# Patient Record
Sex: Female | Born: 1990 | Race: Black or African American | Hispanic: No | Marital: Single | Smoking: Current every day smoker
Health system: Southern US, Community
[De-identification: ages and names within clinical notes are randomized; demographics above are authoritative.]

## PROBLEM LIST (undated history)

## (undated) DIAGNOSIS — J302 Other seasonal allergic rhinitis: Secondary | ICD-10-CM

## (undated) HISTORY — DX: Other seasonal allergic rhinitis: J30.2

---

## 2010-01-18 ENCOUNTER — Other Ambulatory Visit: Admission: RE | Admit: 2010-01-18 | Discharge: 2010-01-18 | Payer: Self-pay | Admitting: *Deleted

## 2011-10-12 ENCOUNTER — Encounter: Payer: Self-pay | Admitting: Emergency Medicine

## 2011-10-12 ENCOUNTER — Emergency Department (INDEPENDENT_AMBULATORY_CARE_PROVIDER_SITE_OTHER)
Admission: EM | Admit: 2011-10-12 | Discharge: 2011-10-12 | Disposition: A | Discharge status: Home / Self Care | Payer: 59 | Source: Home / Self Care | Attending: Family Medicine | Admitting: Family Medicine

## 2011-10-12 DIAGNOSIS — J310 Chronic rhinitis: Secondary | ICD-10-CM

## 2011-10-12 MED ORDER — GUAIFENESIN-CODEINE 100-10 MG/5ML PO SYRP: 5.0000 mL | ORAL_SOLUTION | Freq: Four times a day (QID) | ORAL | Status: AC | PRN

## 2011-10-12 MED ORDER — FLUTICASONE PROPIONATE 50 MCG/ACT NA SUSP: 2.0000 | Freq: Every day | NASAL | Status: DC

## 2011-10-12 NOTE — ED Notes (Signed)
Pt here with sinusitis/uri that has been ongoing x 1wk.sx sore throat,post nasal drainage and yellow mocous.pt tried otc cold/cough meds but no relief.

## 2011-10-12 NOTE — ED Provider Notes (Signed)
History     CSN: [id number] Arrival date & time: 10/12/2011 12:15 PM   First MD Initiated Contact with Patient 10/12/11 1206      Chief Complaint  Patient presents with  . Sore Throat  . Sinusitis    (Consider location/radiation/quality/duration/timing/severity/associated sxs/prior treatment) HPI Comments: Crystal Anderson presents for evaluation of nasal congestion, sore throat, and cough over the last week. She reports a hx of allergies. She denies any fever or sick contacts. She has tried OTC preparations without relief.   Patient is a 20 y.o. female presenting with pharyngitis and sinusitis. The history is provided by the patient.  Sore Throat This is a new problem. The current episode started more than 1 week ago. The problem occurs constantly. The problem has not changed since onset.The symptoms are aggravated by nothing. The symptoms are relieved by nothing. She has tried acetaminophen (OTC cough and cold medicine) for the symptoms.  Sinusitis  Associated symptoms include congestion, sore throat and cough. Pertinent negatives include no sinus pressure.    History reviewed. No pertinent past medical history.  History reviewed. No pertinent past surgical history.  No family history on file.  History  Substance Use Topics  . Smoking status: Never Smoker   . Smokeless tobacco: Not on file  . Alcohol Use: Yes    OB History    Grav Para Term Preterm Abortions TAB SAB Ect Mult Living                  Review of Systems  Constitutional: Negative.   HENT: Positive for congestion, sore throat, rhinorrhea and tinnitus. Negative for trouble swallowing and sinus pressure.   Eyes: Negative.   Respiratory: Positive for cough.   Cardiovascular: Negative.   Gastrointestinal: Negative.   Genitourinary: Negative.   Musculoskeletal: Negative.   Skin: Negative.   Neurological: Negative.     Allergies  Review of patient's allergies indicates no known allergies.  Home Medications    No current outpatient prescriptions on file.  BP 121/63  Pulse 71  Temp(Src) 98.9 F (37.2 C) (Oral)  Resp 18  SpO2 99%  LMP 09/13/2011  Physical Exam  Nursing note and vitals reviewed. Constitutional: She is oriented to person, place, and time. She appears well-developed and well-nourished.  HENT:  Head: Normocephalic and atraumatic.  Right Ear: Tympanic membrane is retracted. Tympanic membrane is not erythematous and not bulging.  Left Ear: Tympanic membrane is retracted. Tympanic membrane is not erythematous and not bulging.  Mouth/Throat: Uvula is midline, oropharynx is clear and moist and mucous membranes are normal.  Eyes: EOM are normal.  Neck: Normal range of motion.  Pulmonary/Chest: Effort normal and breath sounds normal.  Neurological: She is alert and oriented to person, place, and time.  Skin: Skin is warm and dry.    ED Course  Procedures (including critical care time)  Labs Reviewed - No data to display No results found.   No diagnosis found.    MDM          Richardo Priest, MD 10/12/11 1336

## 2011-10-19 ENCOUNTER — Emergency Department (HOSPITAL_COMMUNITY)
Admission: EM | Admit: 2011-10-19 | Discharge: 2011-10-19 | Disposition: A | Discharge status: Home / Self Care | Payer: 59 | Source: Home / Self Care | Attending: Family Medicine | Admitting: Family Medicine

## 2011-10-19 ENCOUNTER — Emergency Department (INDEPENDENT_AMBULATORY_CARE_PROVIDER_SITE_OTHER): Payer: 59

## 2011-10-19 ENCOUNTER — Encounter (HOSPITAL_COMMUNITY): Payer: Self-pay | Admitting: Emergency Medicine

## 2011-10-19 DIAGNOSIS — J069 Acute upper respiratory infection, unspecified: Secondary | ICD-10-CM

## 2011-10-19 MED ORDER — AZITHROMYCIN 250 MG PO TABS: ORAL_TABLET | ORAL | Status: AC

## 2011-10-19 MED ORDER — IPRATROPIUM BROMIDE 0.06 % NA SOLN: 2.0000 | Freq: Four times a day (QID) | NASAL | Status: AC

## 2011-10-19 NOTE — ED Notes (Signed)
PT RETURNS TODAY WITH CONTINOUS COUGH WITH THICK YELLOW MUCOUS,CHILLS,AND SORE THROAT THAT'S ONGOING X 2WKS.PT WAS SEEN HERE LAST WEEK FOR SAME SX AND DIAG WITH SINUSITIS.RX COUGH SYRUP,FLONASE NASAL SPRAY NOT WORKING.NO FEVERS REPORTED OR SOB

## 2011-10-19 NOTE — ED Provider Notes (Signed)
History     CSN: [id number] Arrival date & time: 10/19/2011  4:05 PM   First MD Initiated Contact with Patient 10/19/11 1608      Chief Complaint  Patient presents with  . Influenza    (Consider location/radiation/quality/duration/timing/severity/associated sxs/prior treatment) Patient is a 20 y.o. female presenting with URI. The history is provided by the patient.  URI The primary symptoms include headaches and cough. Primary symptoms do not include fever, sore throat, wheezing, abdominal pain, nausea, vomiting or rash. The current episode started more than 1 week ago (seen 11/29 but sx worse.). This is a new problem. The problem has been gradually worsening.  Symptoms associated with the illness include facial pain, sinus pressure, congestion and rhinorrhea.    History reviewed. No pertinent past medical history.  History reviewed. No pertinent past surgical history.  No family history on file.  History  Substance Use Topics  . Smoking status: Current Everyday Smoker  . Smokeless tobacco: Not on file  . Alcohol Use: Yes    OB History    Grav Para Term Preterm Abortions TAB SAB Ect Mult Living                  Review of Systems  Constitutional: Negative for fever.  HENT: Positive for congestion, rhinorrhea and sinus pressure. Negative for sore throat.   Respiratory: Positive for cough. Negative for wheezing.   Gastrointestinal: Negative for nausea, vomiting and abdominal pain.  Skin: Negative for rash.  Neurological: Positive for headaches.    Allergies  Review of patient's allergies indicates no known allergies.  Home Medications   Current Outpatient Rx  Name Route Sig Dispense Refill  . GUAIFENESIN-CODEINE 100-10 MG/5ML PO SYRP Oral Take 5 mLs by mouth every 6 (six) hours as needed for cough or congestion. 120 mL 0  . AZITHROMYCIN 250 MG PO TABS  Take as directed on pack 6 each 0  . IPRATROPIUM BROMIDE 0.06 % NA SOLN Nasal Place 2 sprays into the nose 4  (four) times daily. 15 mL 12    BP 104/69  Pulse 91  Temp(Src) 99.8 F (37.7 C) (Oral)  Resp 18  SpO2 98%  LMP 09/13/2011  Physical Exam  Nursing note and vitals reviewed. Constitutional: She appears well-developed and well-nourished.  HENT:  Head: Normocephalic.  Right Ear: External ear normal.  Left Ear: External ear normal.  Nose: Mucosal edema and rhinorrhea present. Right sinus exhibits maxillary sinus tenderness and frontal sinus tenderness. Left sinus exhibits maxillary sinus tenderness and frontal sinus tenderness.  Mouth/Throat: Oropharynx is clear and moist.  Eyes: Conjunctivae and EOM are normal. Pupils are equal, round, and reactive to light.  Neck: Normal range of motion. Neck supple.  Cardiovascular: Normal rate, normal heart sounds and intact distal pulses.   Pulmonary/Chest: Effort normal and breath sounds normal.  Lymphadenopathy:    She has no cervical adenopathy.    ED Course  Procedures (including critical care time)  Labs Reviewed - No data to display Dg Sinuses Complete  10/19/2011  *RADIOLOGY REPORT*  Clinical Data: Headaches.  History of sinus infections.  PARANASAL SINUSES - COMPLETE 3 + VIEW  Comparison: None.  Findings: Paranasal sinuses appear clear.  No air fluid levels. Visualized bony structures unremarkable.  IMPRESSION: No evidence for sinusitis.  Original Report Authenticated By: Cyndie Chime, M.D.     1. URI (upper respiratory infection)       MDM  X-rays reviewed and report per radiologist.  Barkley Bruns, MD 10/19/11 [phone]

## 2014-05-18 ENCOUNTER — Ambulatory Visit (INDEPENDENT_AMBULATORY_CARE_PROVIDER_SITE_OTHER): Payer: BC Managed Care – PPO | Admitting: Family Medicine

## 2014-05-18 ENCOUNTER — Encounter (INDEPENDENT_AMBULATORY_CARE_PROVIDER_SITE_OTHER): Payer: Self-pay

## 2014-05-18 ENCOUNTER — Ambulatory Visit (INDEPENDENT_AMBULATORY_CARE_PROVIDER_SITE_OTHER): Payer: BC Managed Care – PPO | Attending: Family Medicine

## 2014-05-18 VITALS — BP 120/73 | HR 90 | Temp 98.7°F | Resp 16 | Ht 66.0 in | Wt 273.9 lb

## 2014-05-18 DIAGNOSIS — W19XXXA Unspecified fall, initial encounter: Secondary | ICD-10-CM

## 2014-05-18 DIAGNOSIS — Y998 Other external cause status: Secondary | ICD-10-CM

## 2014-05-18 DIAGNOSIS — Y939 Activity, unspecified: Secondary | ICD-10-CM

## 2014-05-18 DIAGNOSIS — S92301A Fracture of unspecified metatarsal bone(s), right foot, initial encounter for closed fracture: Secondary | ICD-10-CM

## 2014-05-18 DIAGNOSIS — S92309A Fracture of unspecified metatarsal bone(s), unspecified foot, initial encounter for closed fracture: Secondary | ICD-10-CM

## 2014-05-18 DIAGNOSIS — S99921A Unspecified injury of right foot, initial encounter: Secondary | ICD-10-CM

## 2014-05-18 DIAGNOSIS — Y929 Unspecified place or not applicable: Secondary | ICD-10-CM

## 2014-05-18 MED ORDER — HYDROCODONE-ACETAMINOPHEN 5-325 MG PO TABS
1.0000 | ORAL_TABLET | Freq: Four times a day (QID) | ORAL | Status: AC | PRN
Start: 2014-05-18 — End: 2015-05-18

## 2014-05-18 NOTE — Patient Instructions (Signed)
Fracture:Foot    You have a fracture (break) of one of the bones in your foot. This will cause pain, swelling and sometimes bruising. It will take about 4-6 weeks to heal. A foot fracture may be treated with a special shoe, splint, cast or boot.  Home Care:   You may be given a splint, cast, shoe or boot to prevent movement at the injury. Unless you were told otherwise, use crutches or a walker and do not bear weight on the injured foot until cleared by your doctor to do so. (Crutches and walkers can be rented at many pharmacies and surgical/orthopedic supply stores). Do not put weight on a splint; it will break.   Keep your leg elevated to reduce pain and swelling. When sleeping, place a pillow under the injured leg. When sitting, support the injured leg so it is level with your waist. This is very important during the first 48 hours.   Apply an ice pack (ice cubes in a plastic bag, wrapped in a towel) over the injured area for 20 minutes every 1-2 hours the first day. You can place the ice pack directly over the splint/cast. Unless told otherwise, you can open the boot or shoe to apply ice. Continue with ice packs 3-4 times a day for the next two days, then as needed for the relief of pain and swelling.   Keep the splint/cast/boot/shoe dry. When bathing, protect it with a large plastic bag, rubber-banded at the top end. If a fiberglass splint/cast or boot gets wet, you can dry it with a hair-dryer. Unless told otherwise, you can remove a boot or shoe to bathe.   You may use acetaminophen (Tylenol) or ibuprofen (Motrin, Advil) to control pain, unless another pain medicine was prescribed. [NOTE: If you have chronic liver or kidney disease or ever had a stomach ulcer or GI bleeding, talk with your doctor before using these medicines.]  Follow Up  with your doctor within one week, or as advised by our staff, to be sure the bone is healing properly. If you were given a splint, it may be changed to a cast or boot  at your follow-up visit.[NOTE: A radiologist will review any X-rays that were taken. We will notify you of any new findings that may affect your care.]  Get Prompt Medical Attention  if any of the following occur:   The plaster cast or splint becomes wet or soft   The fiberglass cast or splint remains wet for more than 24 hours   Increased tightness or pain under the cast or splint   Toes become swollen, cold, blue, numb or tingly   2000-2014 [employer] [address], [place] [address]. All rights reserved. This information is not intended as a substitute for professional medical care. Always follow your healthcare professional's instructions.

## 2014-05-18 NOTE — Progress Notes (Signed)
Applied an ace wrap to the right foot and also gave the patient crutches with crutch training. Patient felt comfortable with crutches.  No wounds noted underneath splint. Assessed for pain, burning, tingling, and numbness. Patient denies all of the above symptoms after splint applied. Pulses are present, skin is pink and capillary refill is normal.

## 2014-05-18 NOTE — Progress Notes (Signed)
Subjective:    Patient ID: Sherry Goodman is a 23 y.o. female.    Foot Injury   The incident occurred less than 1 hour ago. The injury mechanism was a fall. The pain is present in the right foot. The quality of the pain is described as aching. The pain is moderate. Associated symptoms include an inability to bear weight. Pertinent negatives include no loss of motion, loss of sensation, muscle weakness, numbness or tingling. The symptoms are aggravated by movement, palpation and weight bearing. She has tried nothing for the symptoms.       The following portions of the patient's history were reviewed and updated as appropriate: past surgical history and problem list.    Review of Systems   Constitutional: Negative.    Musculoskeletal: Positive for joint swelling (rt foot), arthralgias and gait problem.   Skin: Negative for rash.   Neurological: Negative for tingling and numbness.         Objective:    BP 120/73 mmHg  Pulse 90  Temp(Src) 98.7 F (37.1 C) (Oral)  Resp 16  Ht 1.676 m (5\' 6" )  Wt 124.24 kg (273 lb 14.4 oz)  BMI 44.23 kg/m2  LMP     Physical Exam   Constitutional: She is oriented to person, place, and time. She appears well-developed and well-nourished. No distress.   Musculoskeletal:        Right ankle: Normal.        Right foot: There is decreased range of motion, tenderness, bony tenderness and swelling. There is no deformity and no laceration.        Feet:    Neurological: She is alert and oriented to person, place, and time.   Skin: She is not diaphoretic.   Psychiatric: She has a normal mood and affect.         Assessment and Plan:       Sherry Goodman was seen today for foot injury.    Diagnoses and associated orders for this visit:    Right foot injury, initial encounter  - XR Foot Right  3 views    Metatarsal fracture, right, closed, initial encounter  - HYDROcodone-acetaminophen (NORCO) 5-325 MG per tablet; Take 1 tablet by mouth every 6 (six) hours as needed for Pain.      Discussed side  effects of norco. Advised not to drive or use machinery. F/u with ortho soon.list provided.  TYLENOL/IBUPROFEN PRN FOR PAIN    Advise to watch for worsening symptoms,with those seek medical help immediately  F/U PRN            Etheleen Sia, MD  [facility]  05/18/2014  8:35 PM

## 2014-05-25 ENCOUNTER — Other Ambulatory Visit
Admission: RE | Admit: 2014-05-25 | Discharge: 2014-05-25 | Disposition: A | Discharge status: Home / Self Care | Payer: Self-pay | Source: Ambulatory Visit | Attending: Family Medicine | Admitting: Family Medicine

## 2014-05-25 ENCOUNTER — Ambulatory Visit (INDEPENDENT_AMBULATORY_CARE_PROVIDER_SITE_OTHER): Payer: BC Managed Care – PPO | Admitting: Family Medicine

## 2014-05-25 ENCOUNTER — Encounter (INDEPENDENT_AMBULATORY_CARE_PROVIDER_SITE_OTHER): Payer: Self-pay

## 2014-05-25 VITALS — BP 113/62 | HR 76 | Temp 98.1°F | Resp 18 | Ht 67.0 in | Wt 270.0 lb

## 2014-05-25 DIAGNOSIS — R300 Dysuria: Secondary | ICD-10-CM | POA: Insufficient documentation

## 2014-05-25 DIAGNOSIS — N390 Urinary tract infection, site not specified: Secondary | ICD-10-CM

## 2014-05-25 LAB — VH POCT UA-AUTOMATED(UCC)
Bilirubin, UA POCT: NEGATIVE
Glucose, UA POCT: NEGATIVE
Ketones, UA POCT: NEGATIVE mg/dL
Nitrite, UA POCT: POSITIVE — AB
PH, UA POCT: 5.5 (ref 4.6–8)
Protein, UA POCT: 30 mg/dL — AB
Specific Gravity, UA POCT: 1.02 mg/dL (ref 1.001–1.035)
Urobilinogen, UA POCT: 1 mg/dL

## 2014-05-25 MED ORDER — SULFAMETHOXAZOLE-TRIMETHOPRIM 800-160 MG PO TABS
1.0000 | ORAL_TABLET | Freq: Two times a day (BID) | ORAL | Status: AC
Start: 2014-05-25 — End: 2014-05-30

## 2014-05-25 MED ORDER — PHENAZOPYRIDINE HCL 200 MG PO TABS
200.0000 mg | ORAL_TABLET | Freq: Three times a day (TID) | ORAL | Status: AC | PRN
Start: 2014-05-25 — End: 2015-05-25

## 2014-05-25 NOTE — Patient Instructions (Signed)
Bladder Infection,Female (Adult)    A bladder infection ("cystitis" or "UTI") usually causes a constant urge to urinate and a burning when passing urine. Urine may be cloudy, smelly or dark. There may be pain in the lower abdomen. A bladder infection occurs when bacteria from the vaginal area enter the bladder opening (urethra). This can occur from sexual intercourse, wearing tight clothing, dehydration and other factors.  Home Care:   Drink lots of fluids (at least 6-8 glasses a day, unless you must restrict fluids for other medical reasons). This will force the medicine into your urinary system and flush the bacteria out of your body.   Avoid sexual intercourse until your symptoms are gone.   Avoid caffeine, alcohol and spicy foods. These can irritate the bladder.   A bladder infection is treated with antibiotics. You may also be given Pyridium (generic = phenazopyridine) to reduce the burning sensation. This medicine will cause your urine to become a bright orange color. The orange urine may stain clothing. You may wear a pad or panty-liner to protect clothing.  Preventing Future Infections:   Always wipe from front to back after a bowel movement.   Keep the genital area clean and dry.   Drink plenty of fluids each day to avoid dehydration.   Both sexual partners should wash before intercourse.   Urinate right after intercourse to flush out the bladder.   Wear cotton underwear and cotton-lined panty hose; avoid tight-fitting pants.   If you are on birth control pills and are having frequent bladder infections, discuss with your doctor.  Follow Up:  Return to this facility or see your doctor if ALL symptoms are not gone after three days of treatment.  Get Prompt Medical Attention  if any of the following occur:   Fever of 100.4F (38C) or higher, or as directed by your healthcare provider   No improvement by the third day of treatment   Increasing back or abdominal pain   Repeated vomiting;  unable to keep medicine down   Weakness, dizziness or fainting   Vaginal discharge   Pain, redness or swelling in the labia (outer vaginal area)   2000-2014 [employer]. [address], [place] [address]. All rights reserved. This information is not intended as a substitute for professional medical care. Always follow your healthcare professional's instructions.

## 2014-05-25 NOTE — Progress Notes (Signed)
Subjective:    Patient ID: Sherry Goodman 23 y.o.    HPI  Patient complains of burning with urination, dysuria, frequency, hesitancy and urgency She has had symptoms for a few days.  Patient denies abnormal smelling urine, hematuria, hesitancy, nausea and vomiting back pain and fever. Patient does not have a history of recurrent UTI.  Patient does not have a history of pyelonephritis.      The following portions of the patient's history were reviewed and updated as appropriate: allergies, current medications, past medical history, past surgical history and problem list.    Review of Systems   Constitutional: Negative for fever and chills.   HENT: Negative for congestion.    Respiratory: Negative for cough.    Cardiovascular: Negative for chest pain.   Gastrointestinal: Negative for nausea and vomiting.   Genitourinary: Positive for dysuria, urgency and frequency. Negative for hematuria and flank pain.   Musculoskeletal: Negative for joint pain.   Skin: Negative for rash.   Neurological: Negative for headaches.         Objective:     Filed Vitals:    05/25/14 1649   BP: 113/62   Pulse: 76   Temp: 98.1 F (36.7 C)   Resp: 18       Physical Exam  Constitutional: oriented to person, place, and time. Appears well-developed.   Pulmonary/Chest: Effort normal and breath sounds normal.  Cardiovascular: regular rate and rhythm  Abdomen: soft, non tender, no organomegaly, no CVA tenderness  Skin: Skin is warm.            Assessment and Plan:       The primary encounter diagnosis was Dysuria. A diagnosis of UTI (lower urinary tract infection) was also pertinent to this visit.    UA ordered and reviewed. Likely UTI. Start on Bactrim. Recommend to drink plenty of fluid, tylenol/NSAIDS, urine culture will be sent. If no better or gets worst, need to call. The patient understands and agree with the plan.       Jodelle Red, MD  [facility]  05/25/2014 5:38 PM

## 2014-05-25 NOTE — Progress Notes (Signed)
Urine Culture Sent.    [facility] No        Sherry Goodman Sherry Goodman  05/25/2014  5:05 PM

## 2014-05-29 ENCOUNTER — Telehealth (INDEPENDENT_AMBULATORY_CARE_PROVIDER_SITE_OTHER): Payer: Self-pay

## 2014-05-29 NOTE — Telephone Encounter (Signed)
Called to follow up with patient. Left message and told to call back if needed and provided our number.

## 2017-05-10 ENCOUNTER — Emergency Department (HOSPITAL_COMMUNITY): Payer: Medicaid Other

## 2017-05-10 ENCOUNTER — Encounter (HOSPITAL_COMMUNITY): Payer: Self-pay

## 2017-05-10 ENCOUNTER — Emergency Department (HOSPITAL_COMMUNITY)
Admission: EM | Admit: 2017-05-10 | Discharge: 2017-05-10 | Disposition: A | Discharge status: Home / Self Care | Payer: Medicaid Other | Attending: Emergency Medicine | Admitting: Emergency Medicine

## 2017-05-10 DIAGNOSIS — Z3201 Encounter for pregnancy test, result positive: Secondary | ICD-10-CM | POA: Insufficient documentation

## 2017-05-10 DIAGNOSIS — F17200 Nicotine dependence, unspecified, uncomplicated: Secondary | ICD-10-CM | POA: Diagnosis not present

## 2017-05-10 DIAGNOSIS — Z79899 Other long term (current) drug therapy: Secondary | ICD-10-CM | POA: Diagnosis not present

## 2017-05-10 DIAGNOSIS — K8020 Calculus of gallbladder without cholecystitis without obstruction: Secondary | ICD-10-CM | POA: Insufficient documentation

## 2017-05-10 DIAGNOSIS — R1013 Epigastric pain: Secondary | ICD-10-CM | POA: Diagnosis present

## 2017-05-10 LAB — URINALYSIS, ROUTINE W REFLEX MICROSCOPIC
Bilirubin Urine: NEGATIVE
Glucose, UA: 50 mg/dL — AB
Hgb urine dipstick: NEGATIVE
Ketones, ur: 20 mg/dL — AB
Leukocytes, UA: NEGATIVE
NITRITE: NEGATIVE
Protein, ur: NEGATIVE mg/dL
SPECIFIC GRAVITY, URINE: 1.019 (ref 1.005–1.030)
pH: 6 (ref 5.0–8.0)

## 2017-05-10 LAB — CBC
HCT: 36 % (ref 36.0–46.0)
HEMOGLOBIN: 12.3 g/dL (ref 12.0–15.0)
MCH: 27.9 pg (ref 26.0–34.0)
MCHC: 34.2 g/dL (ref 30.0–36.0)
MCV: 81.6 fL (ref 78.0–100.0)
Platelets: 345 10*3/uL (ref 150–400)
RBC: 4.41 MIL/uL (ref 3.87–5.11)
RDW: 13.9 % (ref 11.5–15.5)
WBC: 8.3 10*3/uL (ref 4.0–10.5)

## 2017-05-10 LAB — COMPREHENSIVE METABOLIC PANEL
ALK PHOS: 72 U/L (ref 38–126)
ALT: 12 U/L — ABNORMAL LOW (ref 14–54)
ANION GAP: 8 (ref 5–15)
AST: 18 U/L (ref 15–41)
Albumin: 3.8 g/dL (ref 3.5–5.0)
BILIRUBIN TOTAL: 0.4 mg/dL (ref 0.3–1.2)
BUN: 9 mg/dL (ref 6–20)
CALCIUM: 9 mg/dL (ref 8.9–10.3)
CO2: 22 mmol/L (ref 22–32)
Chloride: 103 mmol/L (ref 101–111)
Creatinine, Ser: 0.89 mg/dL (ref 0.44–1.00)
GFR calc non Af Amer: >60 mL/min (ref >60)
Glucose, Bld: 167 mg/dL — ABNORMAL HIGH (ref 65–99)
Potassium: 3.3 mmol/L — ABNORMAL LOW (ref 3.5–5.1)
SODIUM: 133 mmol/L — AB (ref 135–145)
TOTAL PROTEIN: 7.6 g/dL (ref 6.5–8.1)

## 2017-05-10 LAB — LIPASE, BLOOD: Lipase: 21 U/L (ref 11–51)

## 2017-05-10 LAB — I-STAT BETA HCG BLOOD, ED (MC, WL, AP ONLY): HCG, QUANTITATIVE: 38.8 m[IU]/mL — AB (ref <5)

## 2017-05-10 MED ORDER — ONDANSETRON 4 MG PO TBDP
ORAL_TABLET | ORAL | Status: AC
  Filled 2017-05-10: qty 1

## 2017-05-10 MED ORDER — METOCLOPRAMIDE HCL 5 MG/ML IJ SOLN
10.0000 mg | Freq: Once | INTRAMUSCULAR | Status: AC
  Administered 2017-05-10: 10 mg via INTRAVENOUS
  Filled 2017-05-10: qty 2

## 2017-05-10 MED ORDER — VITAMIN B-6 100 MG PO TABS
100.0000 mg | ORAL_TABLET | Freq: Every day | ORAL | Status: DC
  Administered 2017-05-10: 100 mg via ORAL
  Filled 2017-05-10: qty 1

## 2017-05-10 MED ORDER — POTASSIUM CHLORIDE CRYS ER 20 MEQ PO TBCR
40.0000 meq | EXTENDED_RELEASE_TABLET | Freq: Once | ORAL | Status: AC
Start: 2017-05-10 — End: 2017-05-10
  Administered 2017-05-10: 40 meq via ORAL
  Filled 2017-05-10: qty 2

## 2017-05-10 MED ORDER — SODIUM CHLORIDE 0.9 % IV BOLUS (SEPSIS)
1000.0000 mL | Freq: Once | INTRAVENOUS | Status: AC
  Administered 2017-05-10: 1000 mL via INTRAVENOUS

## 2017-05-10 MED ORDER — ONDANSETRON 4 MG PO TBDP
4.0000 mg | ORAL_TABLET | Freq: Once | ORAL | Status: AC | PRN
  Administered 2017-05-10: 4 mg via ORAL

## 2017-05-10 MED ORDER — ALUM & MAG HYDROXIDE-SIMETH 200-200-20 MG/5ML PO SUSP
15.0000 mL | Freq: Once | ORAL | Status: AC
Start: 2017-05-10 — End: 2017-05-10
  Administered 2017-05-10: 15 mL via ORAL
  Filled 2017-05-10: qty 30

## 2017-05-10 MED ORDER — PRENATAL COMPLETE 14-0.4 MG PO TABS: 1.0000 | ORAL_TABLET | Freq: Every day | ORAL | 0 refills | Status: AC

## 2017-05-10 MED ORDER — ACETAMINOPHEN 325 MG PO TABS
650.0000 mg | ORAL_TABLET | Freq: Once | ORAL | Status: AC
  Administered 2017-05-10: 650 mg via ORAL
  Filled 2017-05-10: qty 2

## 2017-05-10 MED ORDER — METOCLOPRAMIDE HCL 10 MG PO TABS: 10.0000 mg | ORAL_TABLET | Freq: Three times a day (TID) | ORAL | 0 refills | Status: AC | PRN

## 2017-05-10 NOTE — ED Notes (Signed)
Pt stated that she could not use the bathroom right now.

## 2017-05-10 NOTE — ED Provider Notes (Signed)
[facility]Y DEPT Provider Note   CSN: [id number] Arrival date & time: 05/10/17  6295     History   Chief Complaint Chief Complaint  Patient presents with  . Abdominal Pain    HPI Crystal Anderson is a 26 y.o. female.  The history is provided by the patient and medical records. No language interpreter was used.  Abdominal Pain   Associated symptoms include nausea. Pertinent negatives include diarrhea, vomiting and constipation.   Crystal Anderson is an otherwise healthy 26 y.o. female who presents to the Emergency Department complaining of epigastric abdominal pain intermittently for several months, but acutely worsening over the last 2-3 days. Patient states she initially thought that spicy foods were triggering her symptoms, however as time progressed, every time she ate, her symptoms would return. Over the last couple of days, she has not wanted to eat and has felt nauseous. She tried some type of OTC gas medicine with little relief. She is about a week late for menses, typically having very regular cycles. Did have unprotected intercourse and concerned she may be pregnant. No lower abdominal pain, dysuria, vaginal discharge, urinary urgency/frequency, back pain, chest pain, trouble breathing, fevers/chills.   History reviewed. No pertinent past medical history.  There are no active problems to display for this patient.   History reviewed. No pertinent surgical history.  OB History    No data available       Home Medications    Prior to Admission medications   Medication Sig Start Date End Date Taking? Authorizing Provider  acetaminophen (TYLENOL) 325 MG tablet Take 650 mg by mouth every 6 (six) hours as needed for mild pain.   Yes [provider]  diphenhydrAMINE (BENADRYL) 25 MG tablet Take 25 mg by mouth every 6 (six) hours as needed for allergies.   Yes [provider]  ibuprofen (ADVIL,MOTRIN) 200 MG tablet Take 200 mg by mouth every 6 (six) hours  as needed for moderate pain.   Yes [provider]  ipratropium (ATROVENT) 0.06 % nasal spray Place 2 sprays into the nose 4 (four) times daily. 10/19/11 10/18/12  Linna Hoff, MD  metoCLOPramide (REGLAN) 10 MG tablet Take 1 tablet (10 mg total) by mouth every 8 (eight) hours as needed for refractory nausea / vomiting. 05/10/17   Ward, Chase Picket, PA-C  Prenatal Vit-Fe Fumarate-FA (PRENATAL COMPLETE) 14-0.4 MG TABS Take 1 tablet by mouth daily. 05/10/17   Ward, Chase Picket, PA-C    Family History No family history on file.  Social History Social History  Substance Use Topics  . Smoking status: Current Every Day Smoker  . Smokeless tobacco: Never Used  . Alcohol use Yes     Comment: Occasional      Allergies   Patient has no known allergies.   Review of Systems Review of Systems  Gastrointestinal: Positive for abdominal pain and nausea. Negative for blood in stool, constipation, diarrhea and vomiting.  All other systems reviewed and are negative.    Physical Exam Updated Vital Signs BP (!) 100/58 (BP Location: Left Arm)   Pulse 70   Temp 97.5 F (36.4 C) (Oral)   Resp 18   Ht 5\' 6"  (1.676 m)   Wt 104.3 kg (230 lb)   LMP 04/01/2017   SpO2 100%   BMI 37.12 kg/m   Physical Exam  Constitutional: She is oriented to person, place, and time. She appears well-developed and well-nourished. No distress.  HENT:  Head: Normocephalic and atraumatic.  Cardiovascular: Normal rate,  regular rhythm and normal heart sounds.   No murmur heard. Pulmonary/Chest: Effort normal and breath sounds normal. No respiratory distress.  Abdominal: Soft. Bowel sounds are normal. She exhibits no distension.  Tenderness to palpation of epigastrium and RUQ. Negative Murphy's. No rebound or guarding.  Musculoskeletal: She exhibits no edema.  Neurological: She is alert and oriented to person, place, and time.  Skin: Skin is warm and dry.  Nursing note and vitals reviewed.    ED  Treatments / Results  Labs (all labs ordered are listed, but only abnormal results are displayed) Labs Reviewed  COMPREHENSIVE METABOLIC PANEL - Abnormal; Notable for the following:       Result Value   Sodium 133 (*)    Potassium 3.3 (*)    Glucose, Bld 167 (*)    ALT 12 (*)    All other components within normal limits  URINALYSIS, ROUTINE W REFLEX MICROSCOPIC - Abnormal; Notable for the following:    APPearance HAZY (*)    Glucose, UA 50 (*)    Ketones, ur 20 (*)    All other components within normal limits  I-STAT BETA HCG BLOOD, ED ([facility], [facility], [facility] ONLY) - Abnormal; Notable for the following:    I-stat hCG, quantitative 38.8 (*)    All other components within normal limits  LIPASE, BLOOD  CBC    EKG  EKG Interpretation None       Radiology US Abdomen Limited Ruq  Result Date: 05/10/2017 CLINICAL DATA:  Epigastric pain EXAM: ULTRASOUND ABDOMEN LIMITED RIGHT UPPER QUADRANT COMPARISON:  None in PACs FINDINGS: Gallbladder: The gallbladder is adequately distended and filled with sludge. One stone is visible measuring 1.8 cm in diameter. There is no gallbladder wall thickening, pericholecystic fluid, or positive sonographic Murphy's sign. Common bile duct: Diameter: 3.6 mm.  No intraluminal stones or sludge are observed. Liver: No focal lesion identified. Within normal limits in parenchymal echogenicity. IMPRESSION: Gallstones and sludge. No sonographic evidence of acute cholecystitis. Normal appearance of the liver and common bile Electronically Signed   By: David  [place] M.D.   On: 05/10/2017 11:36    Procedures Procedures (including critical care time)  Medications Ordered in ED Medications  ondansetron (ZOFRAN-ODT) 4 MG disintegrating tablet (not administered)  pyridOXINE (VITAMIN B-6) tablet 100 mg (100 mg Oral Given 05/10/17 1151)  ondansetron (ZOFRAN-ODT) disintegrating tablet 4 mg (4 mg Oral Given 05/10/17 0748)  sodium chloride 0.9 % bolus 1,000 mL (0 mLs Intravenous  Stopped 05/10/17 1204)  potassium chloride SA (K-DUR,KLOR-CON) CR tablet 40 mEq (40 mEq Oral Given 05/10/17 1151)  alum & mag hydroxide-simeth (MAALOX/MYLANTA) 200-200-20 MG/5ML suspension 15 mL (15 mLs Oral Given 05/10/17 1059)  acetaminophen (TYLENOL) tablet 650 mg (650 mg Oral Given 05/10/17 1236)  metoCLOPramide (REGLAN) injection 10 mg (10 mg Intravenous Given 05/10/17 1236)     Initial Impression / Assessment and Plan / ED Course  I have reviewed the triage vital signs and the nursing notes.  Pertinent labs & imaging results that were available during my care of the patient were reviewed by me and considered in my medical decision making (see chart for details).    Crystal Anderson is a 26 y.o. female who presents to ED for epigastric abdominal pain, worse with eating for several months. Symptoms acutely worsened over the last week and have now been associated with nausea. On exam, patient is afebrile, hemodynamically stable with tenderness to palpation of epigastrium and RUQ. Negative Murphy's. Hcg at 38.8. She is a week late for  menses. Informed her of + pregnancy test. She is having no urinary symptoms, lower abdominal complaints, vaginal discharge or spotting. No lower abdominal tenderness or CVA tenderness on exam. UA with no signs of infection. Normal white count. Mild hypokalemia which was replenished in ED. RUQ ultrasound was obtained showing gallstones and sludge. No evidence of acute cholecystitis. Clinically doubt acute cholecystitis as well. Symptoms controlled in ED. Tolerating PO.   A&P:  Epigastric pain 2/2 biliary colic: referral to general surgery. Tylenol PRN pain.   Positive pregnancy test, nausea in pregnancy: OBGYN follow up - referral info given. B6 TID and doxylamine at night for nausea. Small amount of reglan PRN refractory nausea/vomiting. Started on prenatal vitamins.   Spoke at length about reasons to return to ER and follow up care. She understands home care  instructions as plan as dictated above. All questions answered.   Final Clinical Impressions(s) / ED Diagnoses   Final diagnoses:  Epigastric pain  Gallstones  Positive pregnancy test    New Prescriptions Discharge Medication List as of 05/10/2017  1:21 PM    START taking these medications   Details  metoCLOPramide (REGLAN) 10 MG tablet Take 1 tablet (10 mg total) by mouth every 8 (eight) hours as needed for refractory nausea / vomiting., Starting Thu 05/10/2017, Print    Prenatal Vit-Fe Fumarate-FA (PRENATAL COMPLETE) 14-0.4 MG TABS Take 1 tablet by mouth daily., Starting Thu 05/10/2017, Print         Ward, Chase PicketJaime Pilcher, PA-C 05/10/17 1457    Doug SouJacubowitz, Sam, MD 05/10/17 1721

## 2017-05-10 NOTE — Discharge Instructions (Signed)
For nausea in pregnancy: Take 75-100mg  of B6 three times a day. Unisom (doxylamine) at night for nausea. These are sold over-the-counter, but if you need help finding these medications or have questions, please ask the pharmacist. Use reglan only as needed for nausea not relieved with the above medicines.   Take prenatal vitamin daily.   Tylenol as needed for abdominal pain.  You will need to follow up with an OBGYN. You can see an OBGYN of your choice. I have listed the women's clinic for your information if you would like to call them to schedule a follow up appointment.   For your gallstones, please call the surgery clinic to schedule a follow up appointment for further discussion of managing your symptoms.   Return to ER for fevers, acute worsening of abdominal pain, inability to keep fluids down due to vomiting, new or worsening symptoms, any additional concerns.

## 2017-05-10 NOTE — ED Notes (Signed)
Gave pt a ginger ale to drink 

## 2017-05-10 NOTE — ED Notes (Signed)
Patient transported to Ultrasound 

## 2017-05-10 NOTE — ED Triage Notes (Signed)
Per Pt, Pt Is coming from home with complaints of upper abdominal pain that started yesterday and has been recurrent for months. Pt reports some nausea and three episodes of vomiting. Denies any change in bowel, urinary symptoms, or vaginal discharge.

## 2017-07-10 ENCOUNTER — Ambulatory Visit (INDEPENDENT_AMBULATORY_CARE_PROVIDER_SITE_OTHER): Payer: Medicaid Other | Admitting: Certified Nurse Midwife

## 2017-07-10 ENCOUNTER — Other Ambulatory Visit (HOSPITAL_COMMUNITY)
Admission: RE | Admit: 2017-07-10 | Discharge: 2017-07-10 | Disposition: A | Discharge status: Home / Self Care | Payer: Medicaid Other | Source: Ambulatory Visit | Attending: Certified Nurse Midwife | Admitting: Certified Nurse Midwife

## 2017-07-10 ENCOUNTER — Encounter: Payer: Self-pay | Admitting: Certified Nurse Midwife

## 2017-07-10 DIAGNOSIS — Z3400 Encounter for supervision of normal first pregnancy, unspecified trimester: Secondary | ICD-10-CM | POA: Insufficient documentation

## 2017-07-10 DIAGNOSIS — Z3402 Encounter for supervision of normal first pregnancy, second trimester: Secondary | ICD-10-CM

## 2017-07-10 DIAGNOSIS — K8020 Calculus of gallbladder without cholecystitis without obstruction: Secondary | ICD-10-CM | POA: Insufficient documentation

## 2017-07-10 DIAGNOSIS — K8010 Calculus of gallbladder with chronic cholecystitis without obstruction: Secondary | ICD-10-CM

## 2017-07-10 DIAGNOSIS — K8000 Calculus of gallbladder with acute cholecystitis without obstruction: Secondary | ICD-10-CM

## 2017-07-10 NOTE — Patient Instructions (Signed)
AREA PEDIATRIC/FAMILY PRACTICE PHYSICIANS  [facility] [address] [place], [place] [address] Phone - [phone]   Fax - [phone]  [facility] [address] [place], [place]  [address] Phone - [phone]   Fax - [phone]  [facility] [address] [place], [place]  [address] Phone - [phone]   Fax - [phone]  [facility] [address] [place], [place]  [address] Phone - [phone]   Fax - [phone]  [facility] [address] [place], [place]  [address] Phone - [phone]   Fax - [phone]  [facility] [address] [place], [place]  [address] Phone - [phone]   Fax - [phone]  [facility] [address] [place], [place]  [address] Phone - [phone]   Fax - [phone]  [facility] AT [facility] [address] [place], [address] [place], [place]  [address] Phone - [phone]   Fax - [phone]  [facility] AT [facility] [address] [place], [place]  [address] Phone - [phone]   Fax - [phone] [facility] AT [place] [address] [place], [place]  [address] Phone - [phone]   Fax - [phone]  [facility] [address] [place], [place]  [address] Phone - [phone]   Fax - [phone]  [facility] [address] [place], [place]  [address] Phone - [phone]   Fax - [phone]  [facility] [address] [place], [place]  [address] Phone - [phone]   Fax - [phone]  [facility] [address], [address] [place], [place]  [address] Phone - [phone]  [facility] [address] [place], [place]  [address] Phone - [phone]   Fax - [phone]  [facility] [address] [place], [place]  [address] Phone - [phone]   Fax - [phone]  [facility] [address] [place], [place]  [address] Phone - [phone]   Fax - [phone]  [facility] [address] [place], [place]  [address] Phone - [phone]   Fax - [phone]   [facility] [address] [place], [place]  [address] Phone - [phone]   Fax - [phone]  [facility] [address] [place], [place]  [address] Phone - [phone]   Fax - [phone]  [facility] [address] [place], [place]  [address] Phone - [phone]   Fax - [phone]  [facility] [address] [place], [place]  [address] Phone - [phone]   Fax - [phone]  [facility] [address] [place], [place]  [address] Phone - [phone]   Fax - [phone] Gerarda Fraction [address] [place], [place]  [address] Phone - [phone]   Fax - [phone]  [facility] [address] [place], [place]  [address] Phone - [phone]   Fax - [phone]  [facility] [place] [address] [place], [place]  [address] Phone - [phone]   Fax - [phone]   Places to have your son circumcised:    [facility] [phone] $480 by 4 wks  [facility] [phone] $244 by 4 wks  [facility] [phone] $175 by 2 wks  [facility]  [phone] $250 by 7 days [facility] [phone] $150 by 4 wks  These prices sometimes change but are roughly what you can expect to pay. Please call and confirm pricing.   Circumcision is considered an elective/non-medically necessary procedure. There are many reasons parents decide to have  their sons circumsized. During the first year of life circumcised males have a reduced risk of urinary tract infections but after this year the rates between circumcised males and uncircumcised males are the same.  It is safe to have your son circumcised outside of the hospital and the places above perform them regularly.

## 2017-07-10 NOTE — Progress Notes (Signed)
Subjective:  Crystal Anderson is a 26 y.o. G1P0 at [redacted]w[redacted]d being seen today for her initial prenatal visit.  She is currently monitored for the following issues for this low-risk pregnancy and has Supervision of normal first pregnancy, antepartum on her problem list.  FOB not involved, her cousin is her support person. Patient reports no complaints.   . Vag. Bleeding: None.  Movement: Absent. Denies leaking of fluid.   The following portions of the patient's history were reviewed and updated as appropriate: allergies, current medications, past family history, past medical history, past social history, past surgical history and problem list. Problem list updated.  Objective:   Vitals:   07/10/17 1431  BP: 132/68  Pulse: 92  Weight: 229 lb (103.9 kg)    Fetal Status: Fetal Heart Rate (bpm): 166 Fundal Height: 14 cm Movement: Absent     General:  Alert, oriented and cooperative. Patient is in no acute distress.  Skin: Skin is warm and dry. No rash noted.   Cardiovascular: Normal heart rate noted  Respiratory: Normal respiratory effort, no problems with respiration noted  Abdomen: Soft, gravid, appropriate for gestational age. Pain/Pressure: Absent     Pelvic: Vag. Bleeding: None     Cervical exam performed        Extremities: Normal range of motion.     Mental Status: Normal mood and affect. Normal behavior. Normal judgment and thought content.  Breasts: breasts appear normal, no suspicious masses, no skin or nipple changes or axillary nodes.  Urinalysis:      Assessment and Plan:  Pregnancy: G1P0 at [redacted]w[redacted]d  1. Supervision of normal first pregnancy, antepartum - [place] MFM OB COMP + 14 WK; Future - OB profile  Preterm labor symptoms and general obstetric precautions including but not limited to vaginal bleeding, contractions, leaking of fluid and fetal movement were reviewed in detail with the patient. Please refer to After Visit Summary for other counseling recommendations.  Return in  about 4 weeks (around 08/07/2017).   Donette Larry, CNM

## 2017-07-10 NOTE — Addendum Note (Signed)
Addended by: Cheree Ditto, DEMETRICE A on: 07/10/2017 03:43 PM   Modules accepted: Orders

## 2017-07-11 LAB — OBSTETRIC PANEL, INCLUDING HIV
Antibody Screen: NEGATIVE
BASOS ABS: 0 10*3/uL (ref 0.0–0.2)
Basos: 0 %
EOS (ABSOLUTE): 0 10*3/uL (ref 0.0–0.4)
Eos: 0 %
HEP B S AG: NEGATIVE
HIV Screen 4th Generation wRfx: NONREACTIVE
Hematocrit: 36.8 % (ref 34.0–46.6)
Hemoglobin: 11.8 g/dL (ref 11.1–15.9)
IMMATURE GRANS (ABS): 0 10*3/uL (ref 0.0–0.1)
IMMATURE GRANULOCYTES: 0 %
Lymphocytes Absolute: 1.7 10*3/uL (ref 0.7–3.1)
Lymphs: 33 %
MCH: 27.8 pg (ref 26.6–33.0)
MCHC: 32.1 g/dL (ref 31.5–35.7)
MCV: 87 fL (ref 79–97)
MONOCYTES: 5 %
Monocytes Absolute: 0.3 10*3/uL (ref 0.1–0.9)
NEUTROS PCT: 62 %
Neutrophils Absolute: 3.1 10*3/uL (ref 1.4–7.0)
PLATELETS: 343 10*3/uL (ref 150–379)
RBC: 4.25 x10E6/uL (ref 3.77–5.28)
RDW: 14.9 % (ref 12.3–15.4)
RPR: NONREACTIVE
RUBELLA: 15.9 {index} (ref >0.99)
Rh Factor: POSITIVE
WBC: 5.1 10*3/uL (ref 3.4–10.8)

## 2017-07-11 LAB — GC/CHLAMYDIA PROBE AMP (~~LOC~~) NOT AT ARMC
CHLAMYDIA, DNA PROBE: NEGATIVE
NEISSERIA GONORRHEA: NEGATIVE

## 2017-07-11 LAB — CYTOLOGY - PAP: DIAGNOSIS: NEGATIVE

## 2017-07-12 LAB — URINE CULTURE: Organism ID, Bacteria: NO GROWTH

## 2017-08-07 ENCOUNTER — Encounter: Payer: Medicaid Other | Admitting: Family Medicine

## 2017-08-10 ENCOUNTER — Ambulatory Visit (HOSPITAL_COMMUNITY): Payer: Medicaid Other | Attending: Certified Nurse Midwife

## 2018-03-31 ENCOUNTER — Encounter (HOSPITAL_COMMUNITY): Payer: Self-pay

## 2018-09-25 IMAGING — US US ABDOMEN LIMITED
1 series · 14 of 25 positions shown · non-contrast
Comparison: None in PACs

CLINICAL DATA: Epigastric pain

EXAM:
ULTRASOUND ABDOMEN LIMITED RIGHT UPPER QUADRANT

[Series 1: us abdomen limited · 0.26mm/px · 14 of 43 slices shown]
[im 1/43]
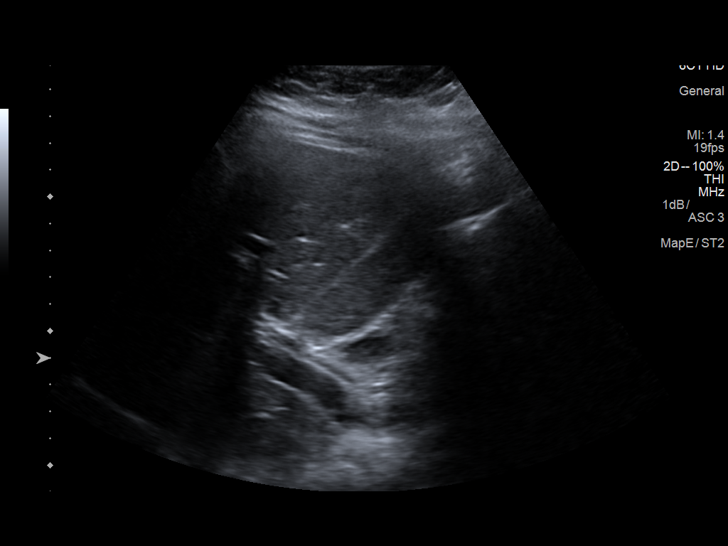
[im 4/43]
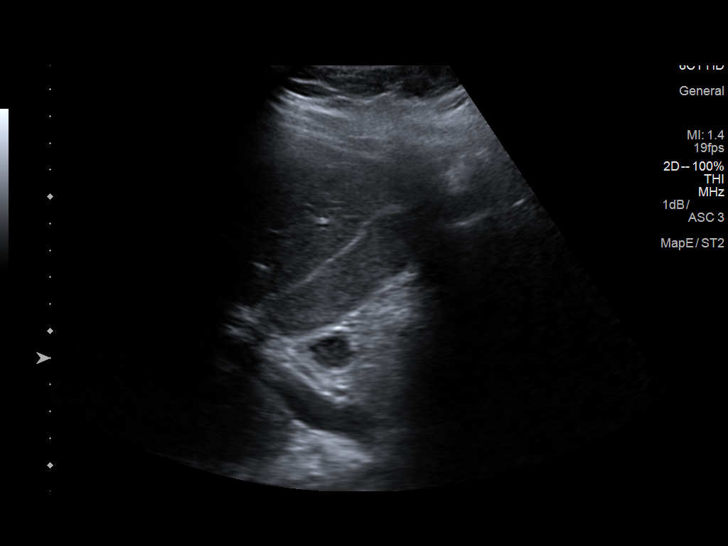
[im 8/43]
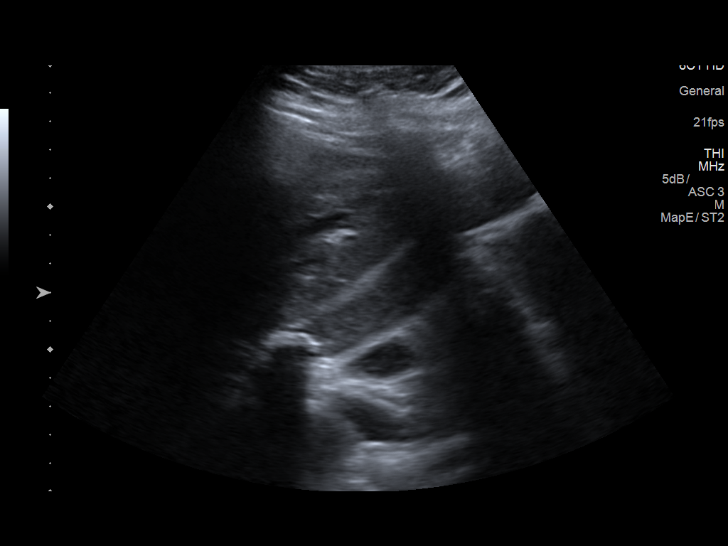
[im 11/43]
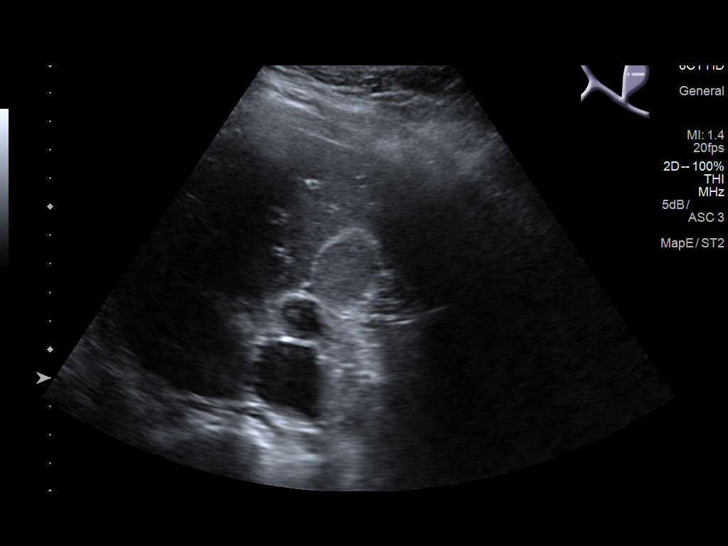
[im 15/43]
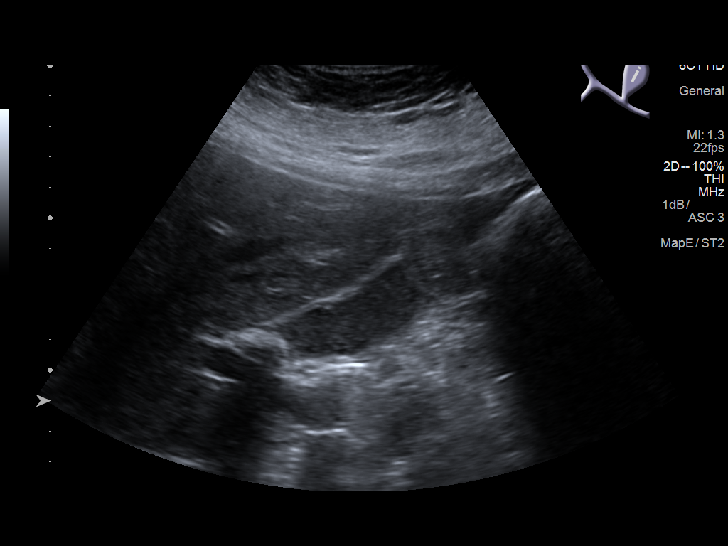
[im 16/43]
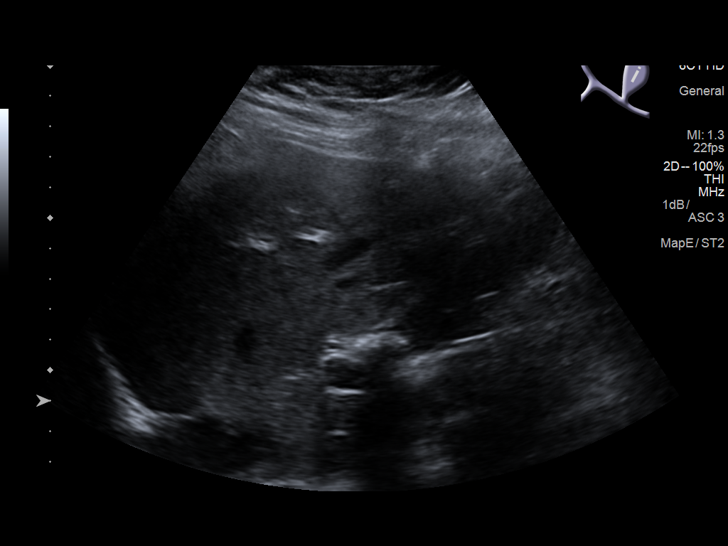
[im 20/43]
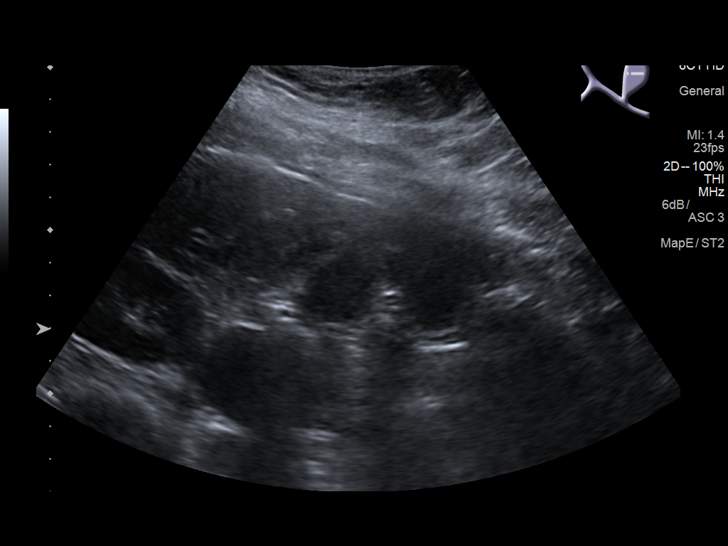
[im 23/43]
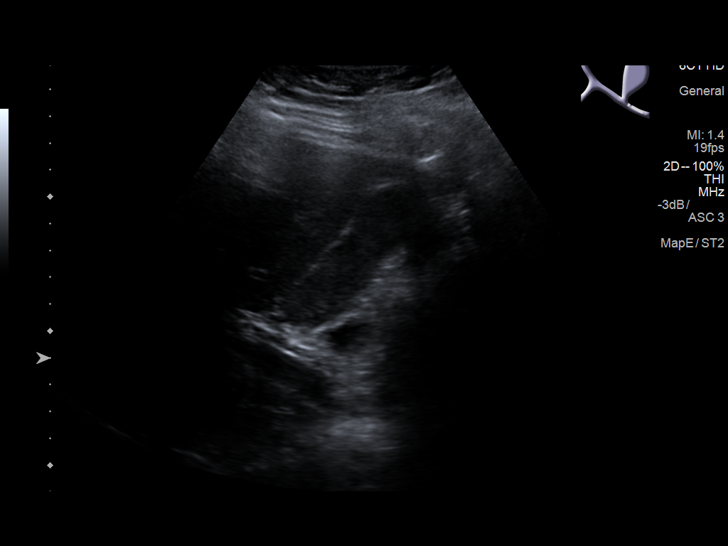
[im 27/43]
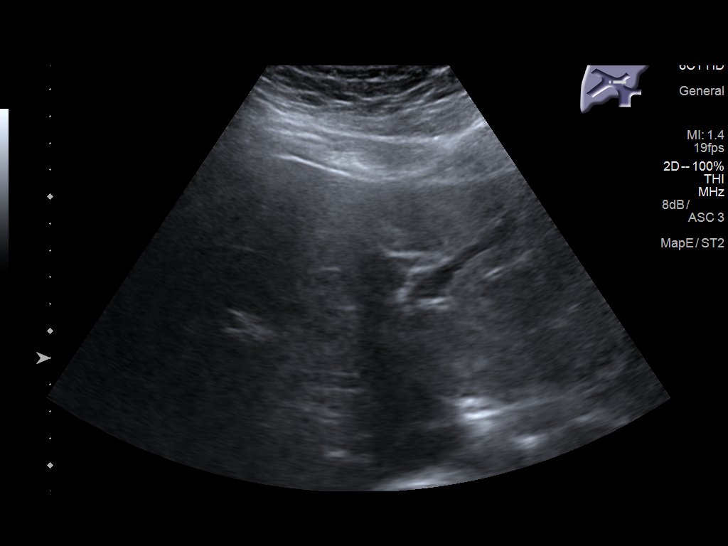
[im 29/43]
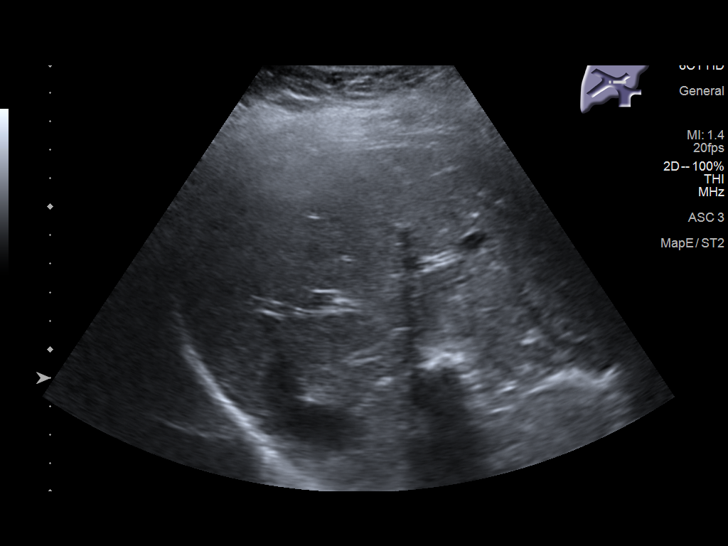
[im 32/43]
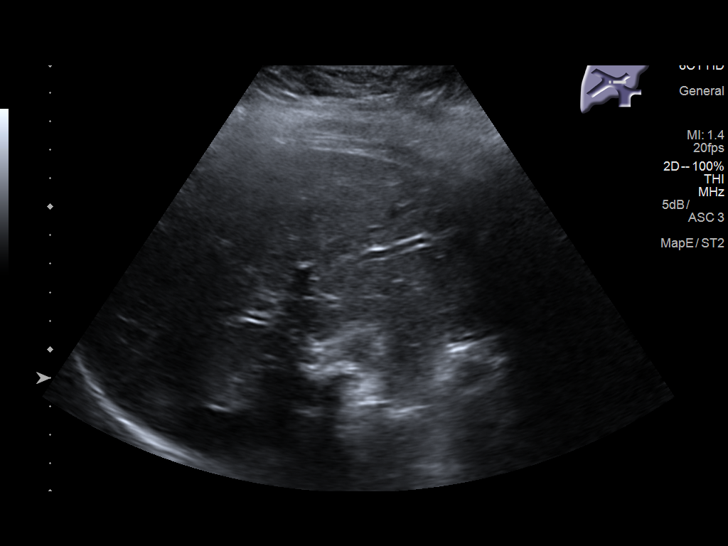
[im 36/43]
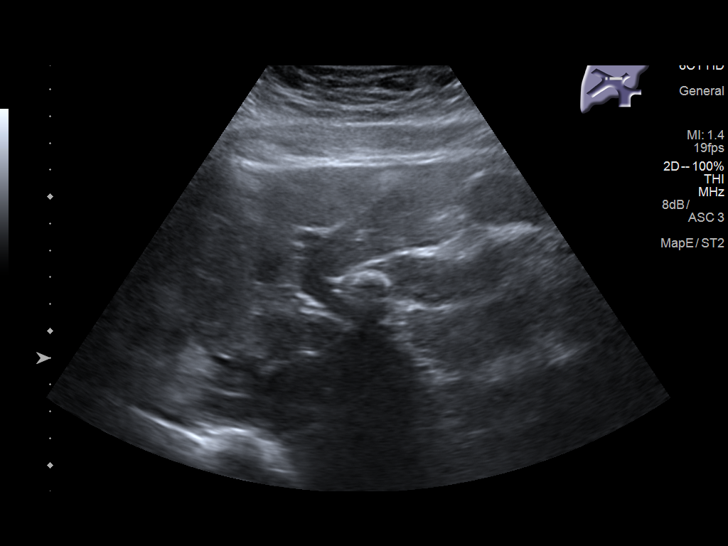
[im 39/43]
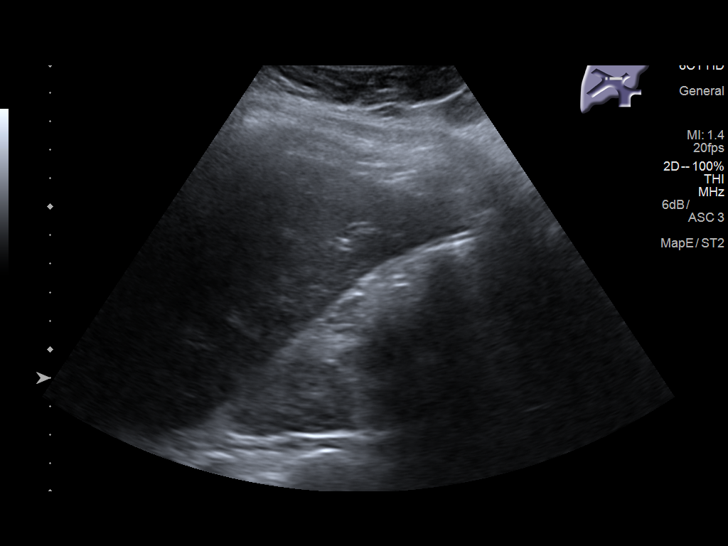
[im 43/43]
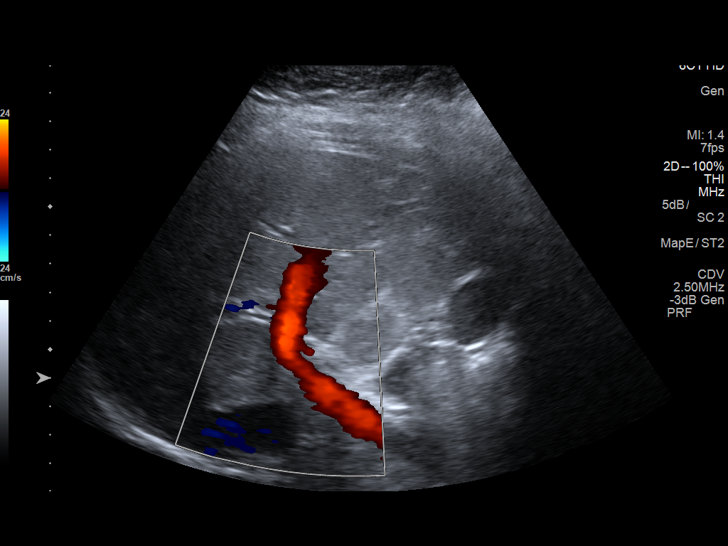

[14 of 25 positions shown; findings below may reference images not displayed]

FINDINGS: Gallbladder:

The gallbladder is adequately distended and filled with sludge. One
stone is visible measuring 1.8 cm in diameter. There is no
gallbladder wall thickening, pericholecystic fluid, or positive
sonographic Murphy's sign.

Common bile duct:

Diameter: 3.6 mm.  No intraluminal stones or sludge are observed.

Liver:

No focal lesion identified. Within normal limits in parenchymal
echogenicity.
IMPRESSION: Gallstones and sludge. No sonographic evidence of acute
cholecystitis.

Normal appearance of the liver and common bile
